# Patient Record
Sex: Female | Born: 1960 | Hispanic: Yes | Marital: Married
Health system: Southern US, Community
[De-identification: ages and names within clinical notes are randomized; demographics above are authoritative.]

## PROBLEM LIST (undated history)

## (undated) HISTORY — PX: NECK SURGERY: SHX720

---

## 2018-02-07 ENCOUNTER — Emergency Department (HOSPITAL_COMMUNITY)
Admission: EM | Admit: 2018-02-07 | Discharge: 2018-02-07 | Disposition: A | Discharge status: Home / Self Care | Payer: No Typology Code available for payment source | Attending: Emergency Medicine | Admitting: Emergency Medicine

## 2018-02-07 ENCOUNTER — Emergency Department (HOSPITAL_COMMUNITY): Payer: No Typology Code available for payment source

## 2018-02-07 ENCOUNTER — Encounter (HOSPITAL_COMMUNITY): Payer: Self-pay | Admitting: Emergency Medicine

## 2018-02-07 DIAGNOSIS — M542 Cervicalgia: Secondary | ICD-10-CM | POA: Diagnosis present

## 2018-02-07 DIAGNOSIS — M7918 Myalgia, other site: Secondary | ICD-10-CM | POA: Insufficient documentation

## 2018-02-07 DIAGNOSIS — M791 Myalgia, unspecified site: Secondary | ICD-10-CM

## 2018-02-07 MED ORDER — METHOCARBAMOL 500 MG PO TABS: 500.0000 mg | ORAL_TABLET | Freq: Two times a day (BID) | ORAL | 0 refills | Status: AC | PRN

## 2018-02-07 MED ORDER — METHOCARBAMOL 500 MG PO TABS
500.0000 mg | ORAL_TABLET | Freq: Once | ORAL | Status: AC
  Administered 2018-02-07: 500 mg via ORAL
  Filled 2018-02-07: qty 1

## 2018-02-07 MED ORDER — ACETAMINOPHEN 500 MG PO TABS
1000.0000 mg | ORAL_TABLET | Freq: Once | ORAL | Status: AC
  Administered 2018-02-07: 1000 mg via ORAL
  Filled 2018-02-07: qty 2

## 2018-02-07 MED ORDER — NAPROXEN 500 MG PO TABS
500.0000 mg | ORAL_TABLET | Freq: Once | ORAL | Status: AC
  Administered 2018-02-07: 500 mg via ORAL
  Filled 2018-02-07: qty 1
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
